# Patient Record
Sex: Male | Born: 1970 | Race: Black or African American | Hispanic: No | Marital: Single | Smoking: Never smoker
Health system: Southern US, Community
[De-identification: ages and names within clinical notes are randomized; demographics above are authoritative.]

## PROBLEM LIST (undated history)

## (undated) DIAGNOSIS — J939 Pneumothorax, unspecified: Secondary | ICD-10-CM

## (undated) HISTORY — PX: CHEST TUBE INSERTION: SHX231

---

## 2008-12-18 ENCOUNTER — Ambulatory Visit: Payer: Self-pay | Admitting: Cardiothoracic Surgery

## 2008-12-18 ENCOUNTER — Encounter: Payer: Self-pay | Admitting: Emergency Medicine

## 2008-12-18 ENCOUNTER — Ambulatory Visit: Payer: Self-pay | Admitting: Diagnostic Radiology

## 2008-12-18 ENCOUNTER — Inpatient Hospital Stay (HOSPITAL_COMMUNITY): Admission: EM | Admit: 2008-12-18 | Discharge: 2008-12-22 | Payer: Self-pay | Admitting: Emergency Medicine

## 2008-12-29 ENCOUNTER — Ambulatory Visit: Payer: Self-pay | Admitting: Cardiothoracic Surgery

## 2009-01-06 ENCOUNTER — Encounter: Admission: RE | Admit: 2009-01-06 | Discharge: 2009-01-06 | Payer: Self-pay | Admitting: Cardiothoracic Surgery

## 2009-01-06 ENCOUNTER — Ambulatory Visit: Payer: Self-pay | Admitting: Cardiothoracic Surgery

## 2010-01-30 ENCOUNTER — Emergency Department (HOSPITAL_BASED_OUTPATIENT_CLINIC_OR_DEPARTMENT_OTHER): Admission: EM | Admit: 2010-01-30 | Discharge: 2010-01-30 | Payer: Self-pay | Admitting: Emergency Medicine

## 2010-07-31 NOTE — Assessment & Plan Note (Signed)
OFFICE VISIT   Brett Stewart, Brett Stewart  DOB:  [birth year]                                        January 06, 2009  CHART #:  [id number]   CURRENT PROBLEMS:  Status post right spontaneous pneumothorax treated  with chest tube on December 18, 2008.   PRESENT ILLNESS:  The patient returns for his post hospital office visit  after being treated with a right chest tube for a 90% spontaneous right  pneumothorax.  This was his first recurrence.  He has had no recurrent  symptoms other than some incisional pain at a chest tube site.  The site  is healed, and the suture has been removed.  He is a nonsmoker.  He is  ready to increase his activity level.   PHYSICAL EXAMINATION:  Blood pressure 117/70, pulse 70, respirations 18,  saturation 99% on room air.  Breath sounds are clear and equal, and the  chest tube site is well healed.   IMAGING:  A PA and lateral chest x-ray shows no pneumothorax with clear  lung fields, no pleural effusion.   PLAN:  I told the patient he could resume normal activities but not to  do any heavy lifting more than 20 pounds until 6 weeks after the event  (late November).  He knows to call us if he has recurrent symptoms or  after hours report to the [facility] Emergency Room for chest x-ray.  A  prescription for Lortab 5.0 was provided without refill.   Kerin Perna, M.D.  Electronically Signed   PV/MEDQ  D:  01/06/2009  T:  01/06/2009  Job:  [id number]

## 2010-10-15 IMAGING — CR DG CHEST 1V PORT
1 series · 1 of 1 positions shown · non-contrast
Comparison: Earlier the same date.

CLINICAL DATA: Chest tube placement.

PORTABLE CHEST - 1 VIEW

[view not recorded]
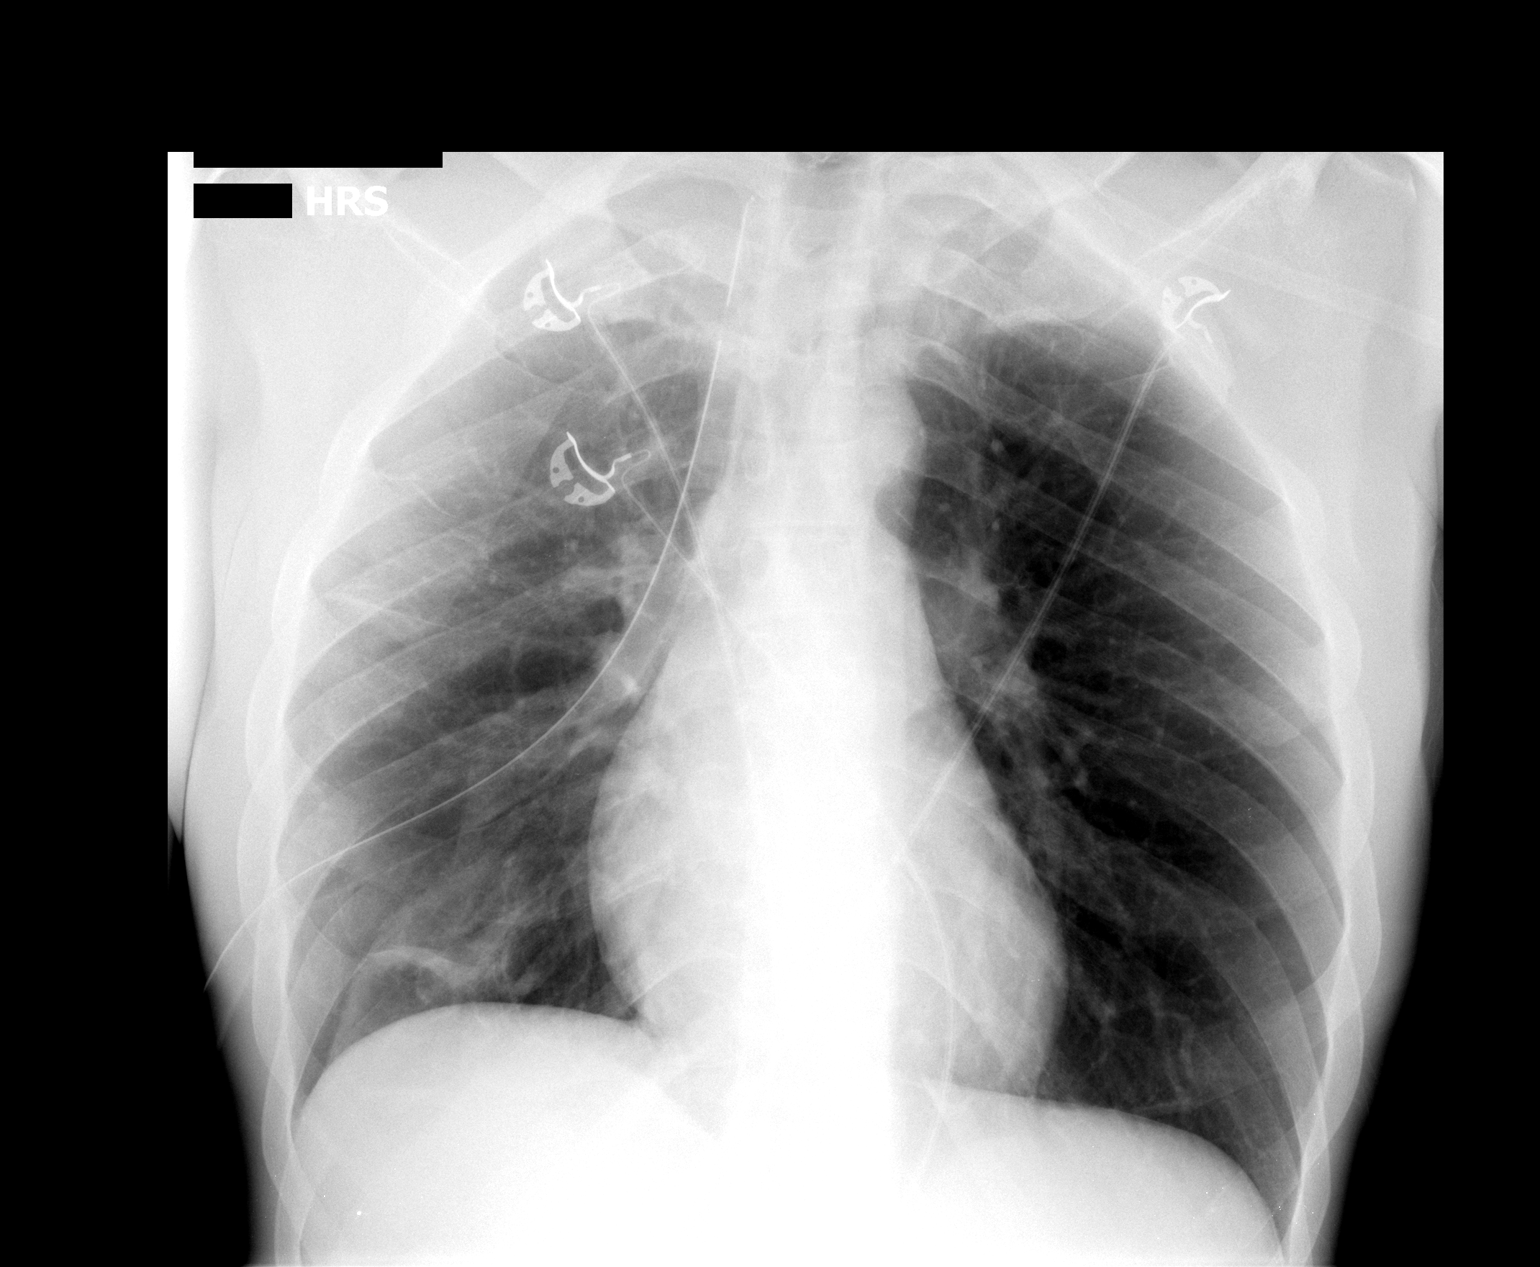

[1 of 1 positions shown; findings below may reference images not displayed]

FINDINGS: 5155 hours.  There has been interval placement of a right
chest tube.  This appears satisfactorily positioned.  The
previously demonstrated large right-sided pneumothorax has been
evacuated.  There may be minimal residual pleural air near the
right costophrenic angle.  There is no mediastinal shift.  The
lungs are clear.  The heart size and mediastinal contours are
stable.
IMPRESSION: Near complete evacuation of large right-sided pneumothorax
following chest tube placement.

## 2010-10-16 IMAGING — CR DG CHEST 1V PORT
1 series · 1 of 1 positions shown · non-contrast
Comparison: 12/18/2008.

CLINICAL DATA: Right-sided spontaneous pneumothorax.

PORTABLE CHEST - 1 VIEW

[view not recorded]
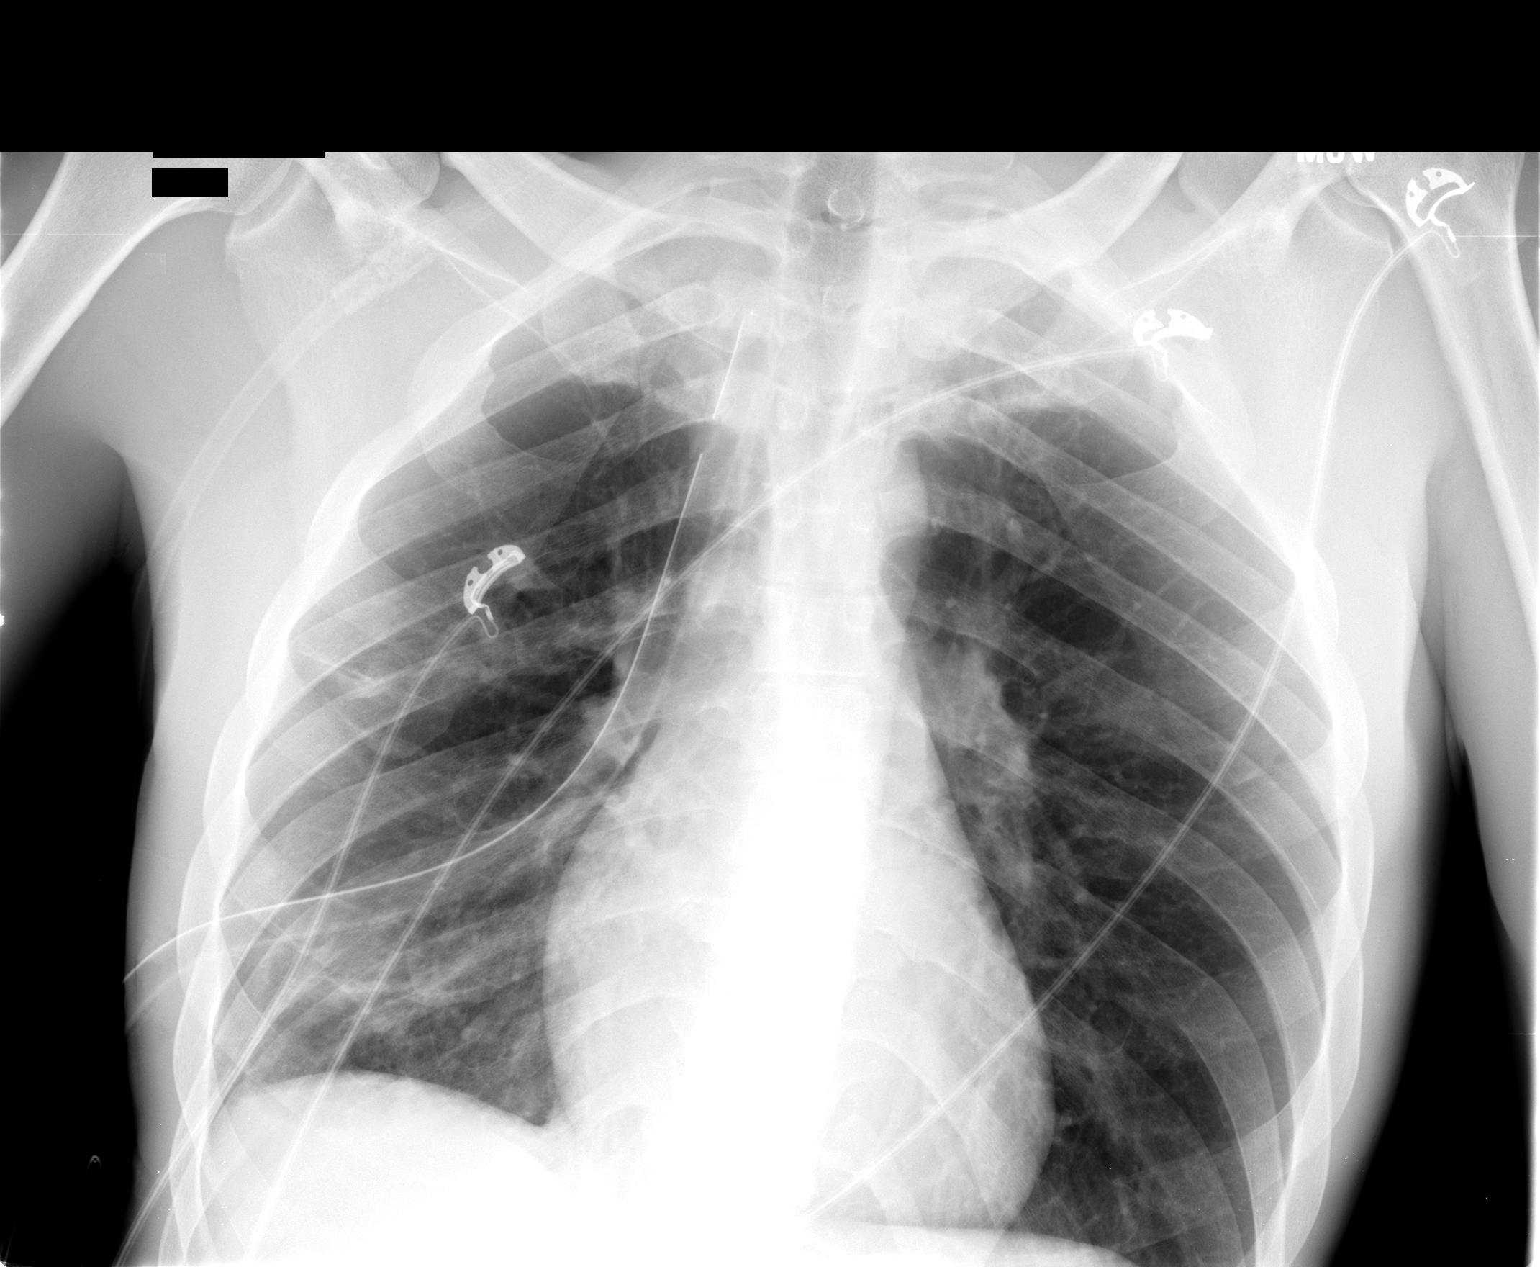

[1 of 1 positions shown; findings below may reference images not displayed]

FINDINGS: Right-sided apically directed thoracostomy tube remains
unchanged compared to prior.  There has been improvement in the
lateral right basilar pneumothorax with probable small area of
loculated or anterior pneumothorax at the right lung base.  This
could also represent a bleb or bulla.  Areas of atelectasis are
present along with pleural reaction along the thoracostomy tube.
Monitoring leads are projected over the chest.  Cardiomediastinal
contours unchanged.  Left lung within normal limits aside from
exclusion of the left costophrenic angle from view.
IMPRESSION: Unchanged right thoracostomy tube. Question small anterior
pneumothorax at the right lung base.

## 2010-10-18 IMAGING — CR DG CHEST 2V
2 series · 2 of 2 positions shown · non-contrast
Comparison: Chest radiograph 12/20/2008

CLINICAL DATA: Pneumothorax

CHEST - 2 VIEW

[w chest pa]
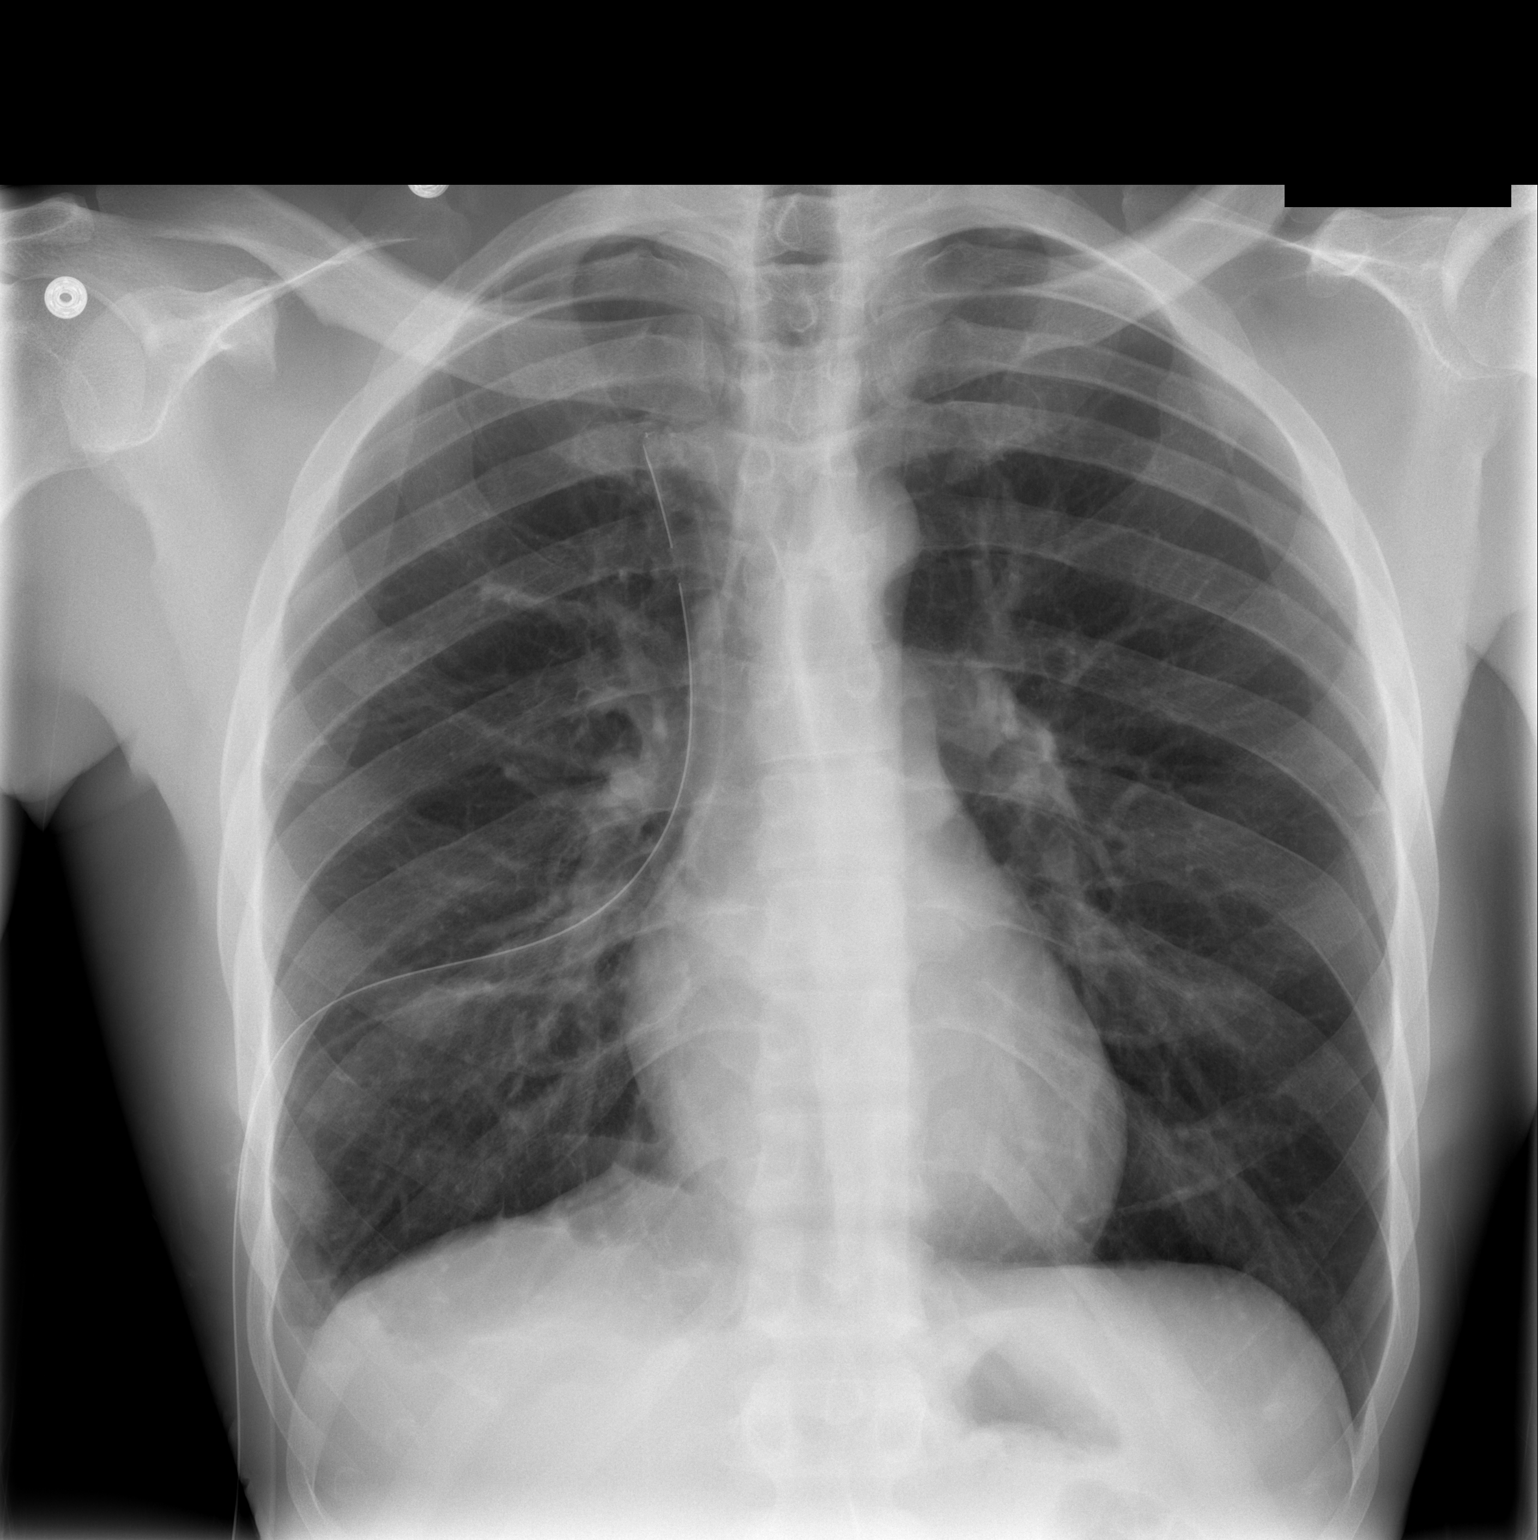

[w chest lat]
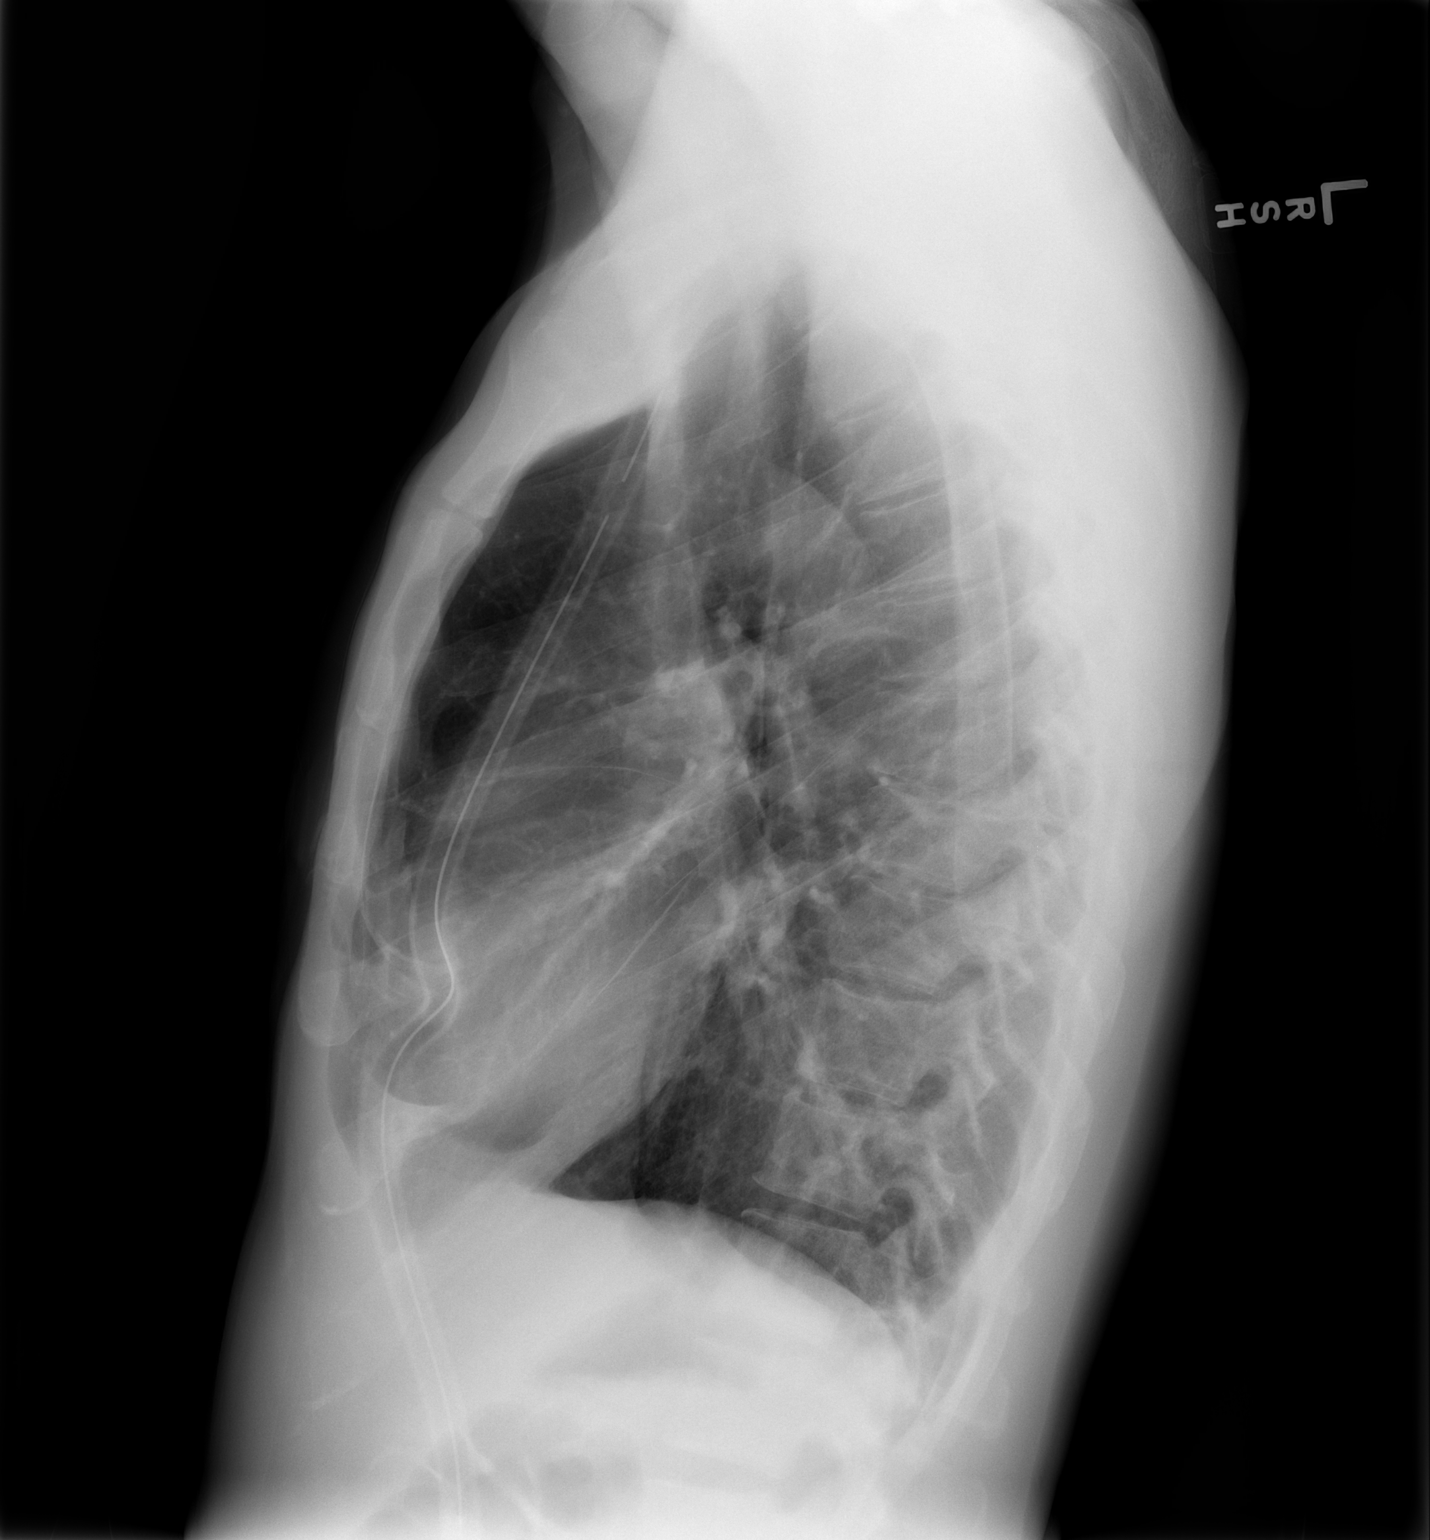

[2 of 2 positions shown; findings below may reference images not displayed]

FINDINGS: Normal mediastinum and cardiac silhouette.  Right chest
tube remains in place.  Trace right apical pneumothorax decreased
from prior.  Small right effusion.  Right lung atelectasis
centrally.  Left lung is clear.
IMPRESSION: 1.  Trace right apical pneumothorax decreased from prior.  Chest
tube in place.
2.  Small right pleural effusion.

## 2010-10-19 IMAGING — CR DG CHEST 2V
2 series · 2 of 2 positions shown · non-contrast
Comparison: 12/21/2008 and earlier

CLINICAL DATA: Right pneumothorax

CHEST - 2 VIEW

[w chest pa]
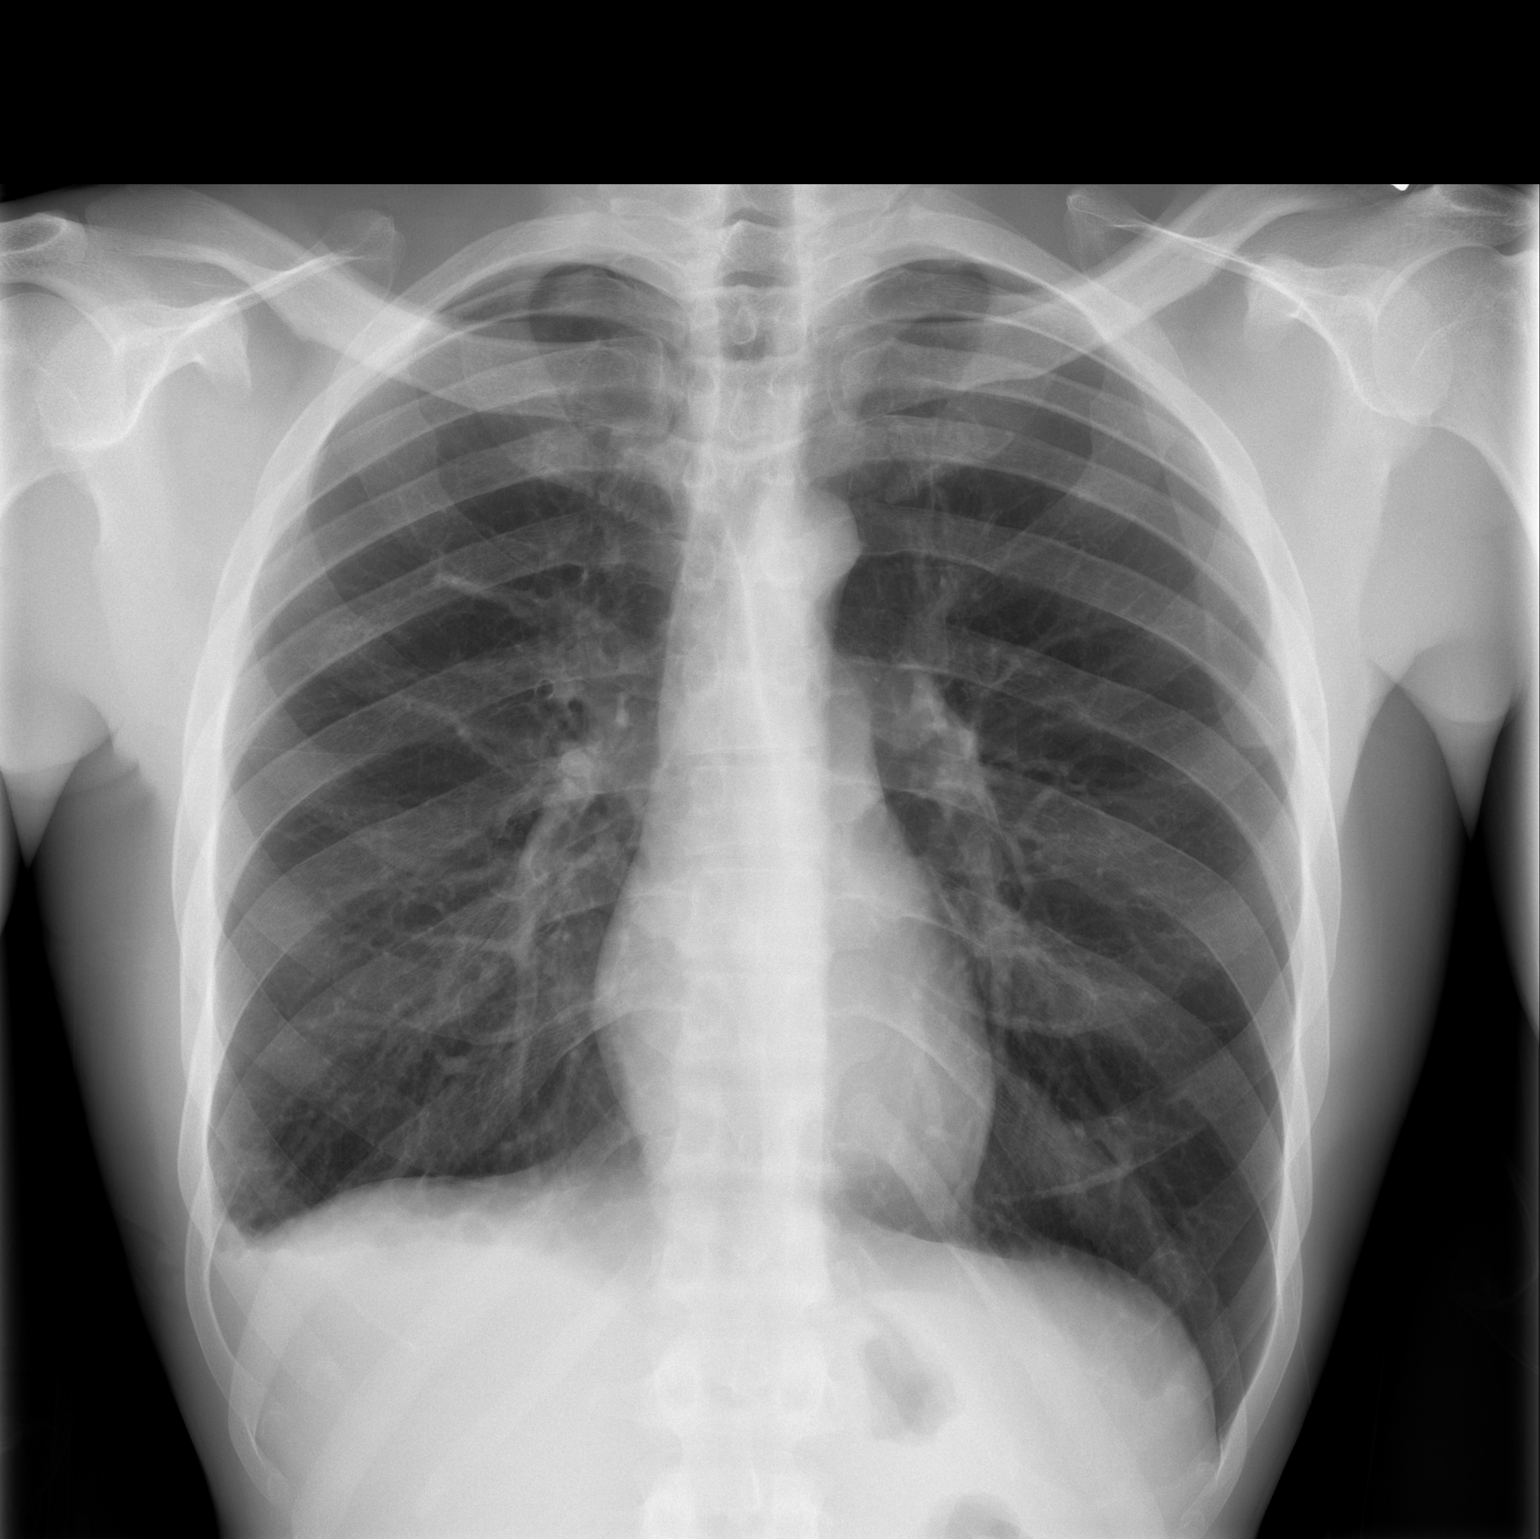

[w chest lat]
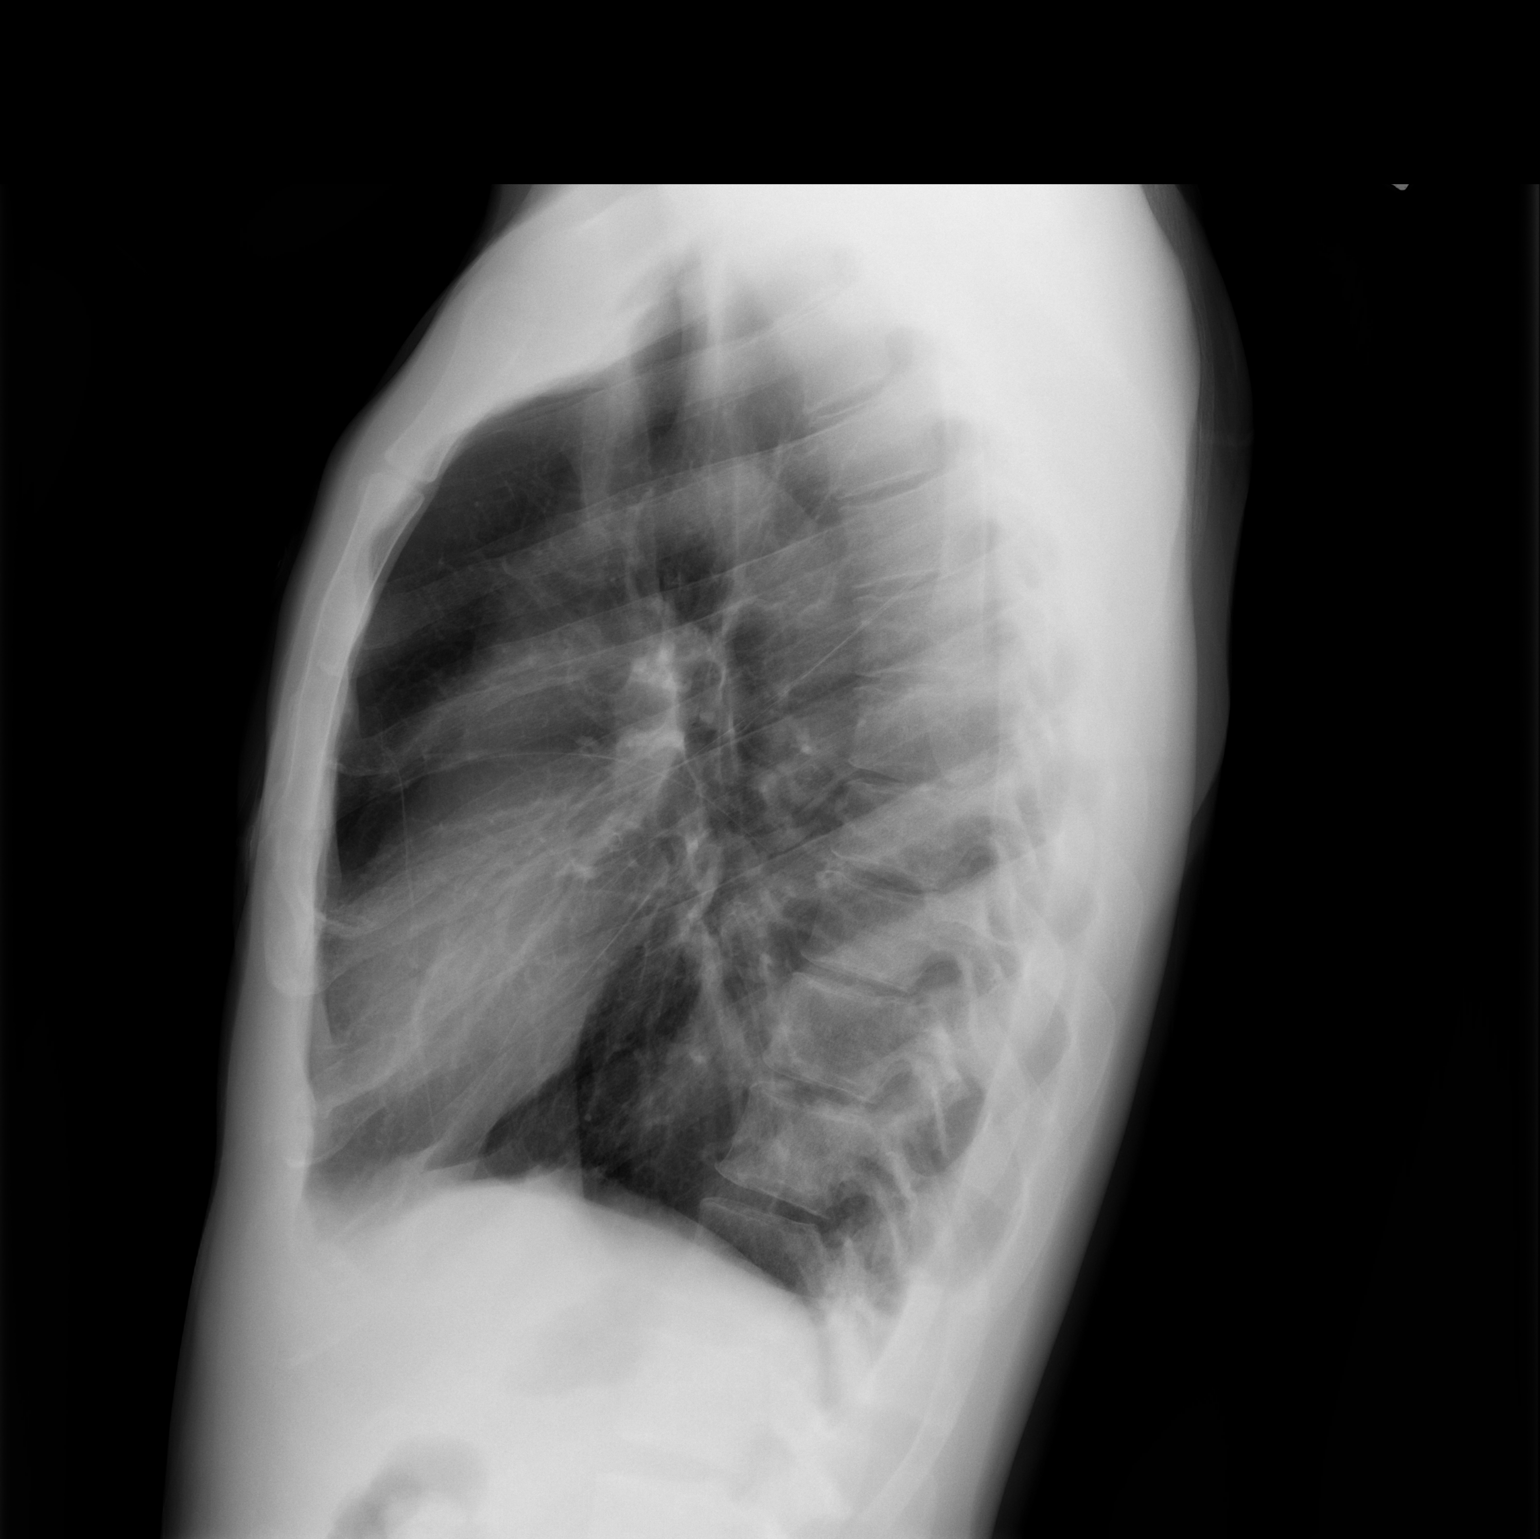

[2 of 2 positions shown; findings below may reference images not displayed]

FINDINGS: No definite right pneumothorax.  Lungs hyperaerated but
clear.  There is either a small right effusion or right pleural
thickening.

Heart and mediastinal contours normal.
IMPRESSION: 1.  No pneumothorax.
2.  Lungs hyperaerated. No active airspace disease.
3.  Small right pleural effusion versus right pleural thickening.

## 2013-03-15 ENCOUNTER — Emergency Department (HOSPITAL_BASED_OUTPATIENT_CLINIC_OR_DEPARTMENT_OTHER)
Admission: EM | Admit: 2013-03-15 | Discharge: 2013-03-15 | Disposition: A | Discharge status: Home / Self Care | Payer: BC Managed Care – PPO | Attending: Emergency Medicine | Admitting: Emergency Medicine

## 2013-03-15 ENCOUNTER — Encounter (HOSPITAL_BASED_OUTPATIENT_CLINIC_OR_DEPARTMENT_OTHER): Payer: Self-pay | Admitting: Emergency Medicine

## 2013-03-15 DIAGNOSIS — B349 Viral infection, unspecified: Secondary | ICD-10-CM

## 2013-03-15 DIAGNOSIS — R059 Cough, unspecified: Secondary | ICD-10-CM | POA: Insufficient documentation

## 2013-03-15 DIAGNOSIS — R05 Cough: Secondary | ICD-10-CM | POA: Insufficient documentation

## 2013-03-15 DIAGNOSIS — B9789 Other viral agents as the cause of diseases classified elsewhere: Secondary | ICD-10-CM | POA: Insufficient documentation

## 2013-03-15 HISTORY — DX: Pneumothorax, unspecified: J93.9

## 2013-03-15 MED ORDER — IBUPROFEN 800 MG PO TABS: 800.0000 mg | ORAL_TABLET | Freq: Three times a day (TID) | ORAL | Status: AC

## 2013-03-15 NOTE — ED Notes (Signed)
Fever, chills, back pain, cough x 3 days-has been around family dx with flu

## 2013-03-15 NOTE — ED Provider Notes (Signed)
Medical screening examination/treatment/procedure(s) were performed by non-physician practitioner and as supervising physician I was immediately available for consultation/collaboration.  EKG Interpretation   None         Gilda Crease, MD 03/15/13 [phone]

## 2013-03-15 NOTE — ED Notes (Signed)
Pt reports generalized body ches, sore throat and a possible fever x 2 days.  Symptoms unrelieved after taking OTC medications and family had the flu.

## 2013-03-15 NOTE — ED Provider Notes (Signed)
CSN: [id number]     Arrival date & time 03/15/13  1416 History   First MD Initiated Contact with Patient 03/15/13 1447     Chief Complaint  Patient presents with  . URI   (Consider location/radiation/quality/duration/timing/severity/associated sxs/prior Treatment) HPI Comments: Pt states that for the last 3 days he has had fever, cough, chills and myalgias. Pt states that family was diagnosed with flu and treated:denies vomiting and diarrhea:has tried otc medication without relief  The history is provided by the patient. No language interpreter was used.    Past Medical History  Diagnosis Date  . Pneumothorax    Past Surgical History  Procedure Laterality Date  . Chest tube insertion     No family history on file. History  Substance Use Topics  . Smoking status: Never Smoker   . Smokeless tobacco: Not on file  . Alcohol Use: No    Review of Systems  Constitutional: Positive for fever.  HENT: Positive for congestion.   Respiratory: Positive for cough.   Cardiovascular: Negative.     Allergies  Review of patient's allergies indicates no known allergies.  Home Medications  No current outpatient prescriptions on file. BP 112/74  Pulse 83  Temp(Src) 98.9 F (37.2 C) (Oral)  Resp 14  Ht 6\' 2"  (1.88 m)  Wt 162 lb (73.483 kg)  BMI 20.79 kg/m2  SpO2 99% Physical Exam  Nursing note and vitals reviewed. Constitutional: He is oriented to person, place, and time. He appears well-developed and well-nourished.  HENT:  Head: Normocephalic and atraumatic.  Right Ear: External ear normal.  Left Ear: External ear normal.  Mouth/Throat: Posterior oropharyngeal erythema present.  Eyes: Conjunctivae and EOM are normal.  Neck: Normal range of motion. Neck supple.  Cardiovascular: Normal rate and regular rhythm.   Pulmonary/Chest: Effort normal and breath sounds normal.  Abdominal: Bowel sounds are normal. There is no tenderness.  Musculoskeletal: Normal range of motion.   Neurological: He is alert and oriented to person, place, and time.  Skin: Skin is warm and dry.    ED Course  Procedures (including critical care time) Labs Review Labs Reviewed - No data to display Imaging Review No results found.  EKG Interpretation   None       MDM   1. Viral illness    Likely viral:discussed with pt that tamiflu would likely not be effective at this time   Teressa Lower, NP 03/15/13 1538

## 2014-04-15 ENCOUNTER — Ambulatory Visit: Payer: Self-pay

## 2016-07-12 ENCOUNTER — Emergency Department (HOSPITAL_BASED_OUTPATIENT_CLINIC_OR_DEPARTMENT_OTHER)
Admission: EM | Admit: 2016-07-12 | Discharge: 2016-07-12 | Disposition: A | Discharge status: Home / Self Care | Payer: BC Managed Care – PPO | Attending: Emergency Medicine | Admitting: Emergency Medicine

## 2016-07-12 ENCOUNTER — Encounter (HOSPITAL_BASED_OUTPATIENT_CLINIC_OR_DEPARTMENT_OTHER): Payer: Self-pay | Admitting: Emergency Medicine

## 2016-07-12 DIAGNOSIS — J029 Acute pharyngitis, unspecified: Secondary | ICD-10-CM | POA: Diagnosis not present

## 2016-07-12 DIAGNOSIS — Z791 Long term (current) use of non-steroidal anti-inflammatories (NSAID): Secondary | ICD-10-CM | POA: Diagnosis not present

## 2016-07-12 LAB — RAPID STREP SCREEN (MED CTR MEBANE ONLY): Streptococcus, Group A Screen (Direct): NEGATIVE

## 2016-07-12 MED ORDER — IBUPROFEN 400 MG PO TABS
400.0000 mg | ORAL_TABLET | Freq: Once | ORAL | Status: AC
  Administered 2016-07-12: 400 mg via ORAL
  Filled 2016-07-12: qty 1

## 2016-07-12 NOTE — ED Notes (Signed)
ED Provider at bedside. 

## 2016-07-12 NOTE — ED Triage Notes (Signed)
Sore throat x 3 days

## 2016-07-12 NOTE — Discharge Instructions (Signed)
It was our pleasure to provide your ER care today - we hope that you feel better.  Your strep test is negative.   Your symptoms are likely the result of a viral illness, like a cold or flu, that should run its course and get better in the next few days.  Rest. Drink plenty of fluids.  Use throat lozenges, and take tylenol or motrin as need.   Follow up with primary care doctor in 1 week if symptoms fail to improve/resolve.  Return to ER if worse, trouble breathing, other concern.

## 2016-07-12 NOTE — ED Provider Notes (Signed)
[facility] Provider Note   CSN: [id number] Arrival date & time: 07/12/16  0808     History   Chief Complaint Chief Complaint  Patient presents with  . Sore Throat    HPI Brett Stewart is a 46 y.o. male.  Patient c/o sore throat for the past 2-3 days. Symptoms constant, persistent, moderate. No trouble breathing or swallowing. Recent non prod cough, runny nose. subj fever.   Denies known ill contacts. No known mono or strep exposure. Denies sob or chest pain.    The history is provided by the patient.  Sore Throat  Pertinent negatives include no chest pain, no headaches and no shortness of breath.    Past Medical History:  Diagnosis Date  . Pneumothorax     There are no active problems to display for this patient.   Past Surgical History:  Procedure Laterality Date  . CHEST TUBE INSERTION         Home Medications    Prior to Admission medications   Medication Sig Start Date End Date Taking? Authorizing Provider  ibuprofen (ADVIL,MOTRIN) 800 MG tablet Take 1 tablet (800 mg total) by mouth 3 (three) times daily. 03/15/13   Teressa Lower, NP    Family History History reviewed. No pertinent family history.  Social History Social History  Substance Use Topics  . Smoking status: Never Smoker  . Smokeless tobacco: Never Used  . Alcohol use No     Allergies   Patient has no known allergies.   Review of Systems Review of Systems  Constitutional: Negative for diaphoresis.  HENT: Positive for sore throat. Negative for trouble swallowing and voice change.   Respiratory: Negative for shortness of breath.   Cardiovascular: Negative for chest pain and leg swelling.  Gastrointestinal: Negative for diarrhea and vomiting.  Musculoskeletal: Negative for neck pain and neck stiffness.  Skin: Negative for rash.  Neurological: Negative for headaches.     Physical Exam Updated Vital Signs BP 118/82 (BP Location: Right Arm)   Pulse (!) 56    Temp 98 F (36.7 C) (Oral)   Resp 16   Ht  (1.905 m)   Wt 76.2 kg   SpO2 100%   BMI 21.00 kg/m   Physical Exam  Constitutional: He appears well-developed and well-nourished. No distress.  HENT:  Right Ear: External ear normal.  Left Ear: External ear normal.  Nose: Nose normal.  Pharynx erythematous. No asymmetric swelling or abscess. No trismus.   Eyes: Conjunctivae are normal.  Neck: Normal range of motion. Neck supple. No tracheal deviation present. No thyromegaly present.  No stiffness or rigidity.  Cardiovascular: Normal rate, regular rhythm, normal heart sounds and intact distal pulses.  Exam reveals no gallop and no friction rub.   No murmur heard. Pulmonary/Chest: Effort normal and breath sounds normal. No accessory muscle usage. No respiratory distress. He has no wheezes.  Abdominal: Soft. Bowel sounds are normal. He exhibits no distension. There is no tenderness.  No HSM.  Musculoskeletal: He exhibits no edema or tenderness.  Lymphadenopathy:    He has no cervical adenopathy.  Neurological: He is alert.  Skin: Skin is warm and dry. No rash noted. He is not diaphoretic.  Psychiatric: He has a normal mood and affect.  Nursing note and vitals reviewed.    ED Treatments / Results  Labs (all labs ordered are listed, but only abnormal results are displayed) Results for orders placed or performed during the hospital encounter of 07/12/16  Rapid strep  screen (not at [facility])  Result Value Ref Range   Streptococcus, Group A Screen (Direct) NEGATIVE NEGATIVE    EKG  EKG Interpretation None       Radiology No results found.  Procedures Procedures (including critical care time)  Medications Ordered in ED Medications  ibuprofen (ADVIL,MOTRIN) tablet 400 mg (not administered)     Initial Impression / Assessment and Plan / ED Course  I have reviewed the triage vital signs and the nursing notes.  Pertinent labs & imaging results that were available during  my care of the patient were reviewed by me and considered in my medical decision making (see chart for details).  Strep screen negative.   Po fluids.  Motrin po.   Symptoms and exam felt most c/w viral pharyngitis/uri.    Final Clinical Impressions(s) / ED Diagnoses   Final diagnoses:  None    New Prescriptions New Prescriptions   No medications on file     Cathren Laine, MD 07/12/16 [phone]

## 2016-07-14 LAB — CULTURE, GROUP A STREP (THRC)

## 2020-03-10 ENCOUNTER — Emergency Department (HOSPITAL_BASED_OUTPATIENT_CLINIC_OR_DEPARTMENT_OTHER)
Admission: EM | Admit: 2020-03-10 | Discharge: 2020-03-10 | Disposition: A | Discharge status: Home / Self Care | Payer: HRSA Program | Attending: Emergency Medicine | Admitting: Emergency Medicine

## 2020-03-10 ENCOUNTER — Other Ambulatory Visit: Payer: Self-pay

## 2020-03-10 ENCOUNTER — Encounter (HOSPITAL_BASED_OUTPATIENT_CLINIC_OR_DEPARTMENT_OTHER): Payer: Self-pay

## 2020-03-10 DIAGNOSIS — R059 Cough, unspecified: Secondary | ICD-10-CM | POA: Diagnosis present

## 2020-03-10 DIAGNOSIS — U071 COVID-19: Secondary | ICD-10-CM | POA: Diagnosis not present

## 2020-03-10 LAB — RESP PANEL BY RT-PCR (FLU A&B, COVID) ARPGX2
Influenza A by PCR: NEGATIVE
Influenza B by PCR: NEGATIVE
SARS Coronavirus 2 by RT PCR: POSITIVE — AB

## 2020-03-10 NOTE — ED Triage Notes (Signed)
Pt arrives ambulatory to ED with c/o cough, body aches, and runny nose X1 week. PT reports that he lost taste last night. Pt is not vaccinated.

## 2020-03-10 NOTE — Discharge Instructions (Addendum)
Please read instructions below.  You can take tylenol or ibuprofen as needed for sore throat or fever.  Drink plenty of water.  Use saline nasal spray for congestion. Follow up with the post Covid clinic at [facility] as needed. You will need to isolate at home for minimum of 14 days starting on 03/06/2020.  At the end of the 14-day period, you will need to be fever free without the help of medication with symptoms improving for minimum of 24 hours to end quarantine. Return to the ER for inability to swallow liquids, difficulty breathing, or new or worsening symptoms.

## 2020-03-10 NOTE — ED Provider Notes (Signed)
[facility] EMERGENCY DEPARTMENT Provider Note   CSN: [id number] Arrival date & time: 03/10/20  1157     History Chief Complaint  Patient presents with  . Cough    Brett Stewart is a 49 y.o. male without significant past medical history, presenting to the emergency department with complaint of URI symptoms that began on Monday night.  He endorses cough which is gradually improving, fever with T-max of 101 F which has resolved, body aches.  He states he has developed a sore throat today, loss of taste yesterday which is what warranted his presentation to the ED for evaluation.  Denies any abdominal complaints, shortness of breath.  He is not vaccinated against Covid.  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  His family members are asymptomatic.  The history is provided by the patient.       Past Medical History:  Diagnosis Date  . Pneumothorax     There are no problems to display for this patient.   Past Surgical History:  Procedure Laterality Date  . CHEST TUBE INSERTION         No family history on file.  Social History   Tobacco Use  . Smoking status: Never Smoker  . Smokeless tobacco: Never Used  Substance Use Topics  . Alcohol use: No  . Drug use: No    Home Medications Prior to Admission medications   Medication Sig Start Date End Date Taking? Authorizing Provider  ibuprofen (ADVIL,MOTRIN) 800 MG tablet Take 1 tablet (800 mg total) by mouth 3 (three) times daily. 03/15/13   Teressa Lower, NP    Allergies    Patient has no known allergies.  Review of Systems   Review of Systems  All other systems reviewed and are negative.   Physical Exam Updated Vital Signs BP 117/79 (BP Location: Left Arm)   Pulse (!) 58   Temp 98.6 F (37 C) (Oral)   Resp 18   Ht 6\' 3"  (1.905 m)   Wt 74.8 kg   SpO2 100%   BMI 20.62 kg/m   Physical Exam Vitals and nursing note reviewed.  Constitutional:      General:  He is not in acute distress.    Appearance: He is well-developed.  HENT:     Head: Normocephalic and atraumatic.  Eyes:     Conjunctiva/sclera: Conjunctivae normal.  Cardiovascular:     Rate and Rhythm: Normal rate.  Pulmonary:     Effort: Pulmonary effort is normal.     Comments: Speaking in full sentences with normal respiratory effort. Neurological:     Mental Status: He is alert.  Psychiatric:        Mood and Affect: Mood normal.        Behavior: Behavior normal.     ED Results / Procedures / Treatments   Labs (all labs ordered are listed, but only abnormal results are displayed) Labs Reviewed  RESP PANEL BY RT-PCR (FLU A&B, COVID) ARPGX2 - Abnormal; Notable for the following components:      Result Value   SARS Coronavirus 2 by RT PCR POSITIVE (*)    All other components within normal limits    EKG None  Radiology No results found.  Procedures Procedures (including critical care time)  Medications Ordered in ED Medications - No data to display  ED Course  I have reviewed the triage vital signs and the nursing notes.  Pertinent labs & imaging results that were  available during my care of the patient were reviewed by me and considered in my medical decision making (see chart for details).    MDM Rules/Calculators/A&P                         Brett Stewart was evaluated in Emergency Department on 03/10/2020 for the symptoms described in the history of present illness. He was evaluated in the context of the global COVID-19 pandemic, which necessitated consideration that the patient might be at risk for infection with the SARS-CoV-2 virus that causes COVID-19. Institutional protocols and algorithms that pertain to the evaluation of patients at risk for COVID-19 are in a state of rapid change based on information released by regulatory bodies including the CDC and federal and state organizations. These policies and algorithms were followed during the patient's care in  the ED.  Patient presenting for URI symptoms began on Monday.  Symptoms are overall gradually improving, however he developed loss of taste yesterday therefore presents to the ED for Covid test.  He is well-appearing and in no distress, afebrile with stable vital signs.  Speaking in full sentences without difficulty.  Recommend continued symptomatic management.  Patient does not qualify for Mab infusions.  Discussed isolation precautions per current CDC guidelines for both him and his family members.  Referral provided to post Covid clinic at [facility] for follow-up as needed as he does not currently of a PCP.  Return precautions discussed.  Discussed results, findings, treatment and follow up. Patient advised of return precautions. Patient verbalized understanding and agreed with plan.  Final Clinical Impression(s) / ED Diagnoses Final diagnoses:  COVID-19    Rx / DC Orders ED Discharge Orders    None       Camrie Stock, [place], PA-C 03/10/20 1449    Jacalyn Lefevre, MD 03/10/20 1521

## 2021-02-05 ENCOUNTER — Encounter (HOSPITAL_BASED_OUTPATIENT_CLINIC_OR_DEPARTMENT_OTHER): Payer: Self-pay

## 2021-02-05 ENCOUNTER — Emergency Department (HOSPITAL_BASED_OUTPATIENT_CLINIC_OR_DEPARTMENT_OTHER)
Admission: EM | Admit: 2021-02-05 | Discharge: 2021-02-05 | Disposition: A | Discharge status: Home / Self Care | Payer: Self-pay | Attending: Emergency Medicine | Admitting: Emergency Medicine

## 2021-02-05 ENCOUNTER — Other Ambulatory Visit: Payer: Self-pay

## 2021-02-05 DIAGNOSIS — Z20822 Contact with and (suspected) exposure to covid-19: Secondary | ICD-10-CM | POA: Insufficient documentation

## 2021-02-05 DIAGNOSIS — R197 Diarrhea, unspecified: Secondary | ICD-10-CM | POA: Insufficient documentation

## 2021-02-05 DIAGNOSIS — R300 Dysuria: Secondary | ICD-10-CM | POA: Insufficient documentation

## 2021-02-05 DIAGNOSIS — J029 Acute pharyngitis, unspecified: Secondary | ICD-10-CM | POA: Insufficient documentation

## 2021-02-05 DIAGNOSIS — R350 Frequency of micturition: Secondary | ICD-10-CM | POA: Insufficient documentation

## 2021-02-05 DIAGNOSIS — R3915 Urgency of urination: Secondary | ICD-10-CM | POA: Insufficient documentation

## 2021-02-05 LAB — RESP PANEL BY RT-PCR (FLU A&B, COVID) ARPGX2
Influenza A by PCR: NEGATIVE
Influenza B by PCR: NEGATIVE
SARS Coronavirus 2 by RT PCR: NEGATIVE

## 2021-02-05 LAB — URINALYSIS, ROUTINE W REFLEX MICROSCOPIC
Bilirubin Urine: NEGATIVE
Glucose, UA: NEGATIVE mg/dL
Hgb urine dipstick: NEGATIVE
Ketones, ur: NEGATIVE mg/dL
Leukocytes,Ua: NEGATIVE
Nitrite: NEGATIVE
Protein, ur: NEGATIVE mg/dL
Specific Gravity, Urine: >=1.03 (ref 1.005–1.030)
pH: 5.5 (ref 5.0–8.0)

## 2021-02-05 NOTE — ED Triage Notes (Addendum)
Pt had temp of 99, sore throat last night. Also c/o burning with urination. Lower abdominal pain. Afebrile during triage, Denies N/V/D.

## 2021-02-05 NOTE — ED Provider Notes (Signed)
[facility] EMERGENCY DEPARTMENT Provider Note   CSN: [id number] Arrival date & time: 02/05/21  1039     History Chief Complaint  Patient presents with   Dysuria   Sore Throat    Brett Stewart is a 50 y.o. male who presents to the emergency department with intermittent dysuria and subjective fever over the last 4 days.  He states he had a urinary tract infection in the remote past which he states feels similar today.  He reports associated diarrhea, sore throat, and general malaise.  No chest pain or shortness of breath.  He denies abdominal pain, nausea, vomiting.  Denies any new sexual partners.  Dysuria seems to be worse in the morning.  He reports associated urinary urgency and frequency.   Dysuria Presenting symptoms: dysuria   Sore Throat      Past Medical History:  Diagnosis Date   Pneumothorax     There are no problems to display for this patient.   Past Surgical History:  Procedure Laterality Date   CHEST TUBE INSERTION         History reviewed. No pertinent family history.  Social History   Tobacco Use   Smoking status: Never   Smokeless tobacco: Never  Substance Use Topics   Alcohol use: No   Drug use: No    Home Medications Prior to Admission medications   Medication Sig Start Date End Date Taking? Authorizing Provider  ibuprofen (ADVIL,MOTRIN) 800 MG tablet Take 1 tablet (800 mg total) by mouth 3 (three) times daily. 03/15/13   Teressa Lower, NP    Allergies    Patient has no known allergies.  Review of Systems   Review of Systems  Genitourinary:  Positive for dysuria.  All other systems reviewed and are negative.  Physical Exam Updated Vital Signs BP 105/66 (BP Location: Left Arm)   Pulse 62   Temp 98.2 F (36.8 C) (Oral)   Resp 16   Ht 6\' 3"  (1.905 m)   Wt 76.2 kg   SpO2 99%   BMI 21.00 kg/m   Physical Exam Vitals and nursing note reviewed.  Constitutional:      General: He is not in acute distress.     Appearance: Normal appearance.  HENT:     Head: Normocephalic and atraumatic.     Mouth/Throat:     Comments: No tonsillar exudate, erythema, or significant edema.  Uvula is midline.  There is moderate amount of posterior pharyngeal erythema. Eyes:     General:        Right eye: No discharge.        Left eye: No discharge.  Cardiovascular:     Comments: Regular rate and rhythm.  S1/S2 are distinct without any evidence of murmur, rubs, or gallops.  Radial pulses are 2+ bilaterally.  Dorsalis pedis pulses are 2+ bilaterally.  No evidence of pedal edema. Pulmonary:     Comments: Clear to auscultation bilaterally.  Normal effort.  No respiratory distress.  No evidence of wheezes, rales, or rhonchi heard throughout. Abdominal:     General: Abdomen is flat. Bowel sounds are normal. There is no distension.     Tenderness: There is no abdominal tenderness. There is no guarding or rebound.  Musculoskeletal:        General: Normal range of motion.     Cervical back: Neck supple.  Skin:    General: Skin is warm and dry.     Findings: No rash.  Neurological:  General: No focal deficit present.     Mental Status: He is alert.  Psychiatric:        Mood and Affect: Mood normal.        Behavior: Behavior normal.    ED Results / Procedures / Treatments   Labs (all labs ordered are listed, but only abnormal results are displayed) Labs Reviewed  RESP PANEL BY RT-PCR (FLU A&B, COVID) ARPGX2  URINALYSIS, ROUTINE W REFLEX MICROSCOPIC    EKG None  Radiology No results found.  Procedures Procedures   Medications Ordered in ED Medications - No data to display  ED Course  I have reviewed the triage vital signs and the nursing notes.  Pertinent labs & imaging results that were available during my care of the patient were reviewed by me and considered in my medical decision making (see chart for details).    MDM Rules/Calculators/A&P                          Brett Stewart is a 50  y.o. male who presents the emerge apartment with dysuria and upper respiratory symptoms.  He is in no respiratory or acute distress at this time.  Urinalysis was ordered in triage.  I will also add on a COVID/influenza swab.  Patient is otherwise healthy and I do not feel that additional blood work was necessary at this time.  COVID/influenza was negative.  Urinalysis was clean.  No signs of infection.  Updated patient.  Given his symptoms this is likely a viral illness.  I have a low suspicion for strep throat, PTA, and RPA at this time.  His vital signs are otherwise stable and he is in no acute distress at this time.  Discussed at length conservative measures including Tylenol/ibuprofen for fever and muscle aches, drinking plenty of fluids, and getting plenty of rest.  Patient expressed full understanding.  He is safe for discharge.    Final Clinical Impression(s) / ED Diagnoses Final diagnoses:  Sore throat  Dysuria    Rx / DC Orders ED Discharge Orders     None        Teressa Lower, [place] 02/05/21 1221    Terald Sleeper, MD 02/05/21 1501

## 2021-02-05 NOTE — Discharge Instructions (Addendum)
You tested negative for influenza and COVID today.  Your urinalysis was also normal.  I still think this is likely a viral illness that will resolve on its own.  Please drink plenty of fluids and get plenty of rest.  Tylenol/ibuprofen for pain and fevers.  Please return to the emergency department if you experience worsening urinary symptoms, severe fevers, trouble swallowing, shortness of breath, or any other concerns you might have.

## 2023-09-03 ENCOUNTER — Other Ambulatory Visit: Payer: Self-pay

## 2023-09-03 ENCOUNTER — Emergency Department (HOSPITAL_BASED_OUTPATIENT_CLINIC_OR_DEPARTMENT_OTHER)
Admission: EM | Admit: 2023-09-03 | Discharge: 2023-09-03 | Disposition: A | Discharge status: Home / Self Care | Attending: Emergency Medicine | Admitting: Emergency Medicine

## 2023-09-03 ENCOUNTER — Emergency Department (HOSPITAL_BASED_OUTPATIENT_CLINIC_OR_DEPARTMENT_OTHER)

## 2023-09-03 ENCOUNTER — Encounter (HOSPITAL_BASED_OUTPATIENT_CLINIC_OR_DEPARTMENT_OTHER): Payer: Self-pay | Admitting: Emergency Medicine

## 2023-09-03 DIAGNOSIS — R0789 Other chest pain: Secondary | ICD-10-CM | POA: Insufficient documentation

## 2023-09-03 DIAGNOSIS — R052 Subacute cough: Secondary | ICD-10-CM | POA: Insufficient documentation

## 2023-09-03 LAB — CBC
HCT: 36.4 % — ABNORMAL LOW (ref 39.0–52.0)
Hemoglobin: 12.6 g/dL — ABNORMAL LOW (ref 13.0–17.0)
MCH: 32.1 pg (ref 26.0–34.0)
MCHC: 34.6 g/dL (ref 30.0–36.0)
MCV: 92.9 fL (ref 80.0–100.0)
Platelets: 187 10*3/uL (ref 150–400)
RBC: 3.92 MIL/uL — ABNORMAL LOW (ref 4.22–5.81)
RDW: 12.8 % (ref 11.5–15.5)
WBC: 8.2 10*3/uL (ref 4.0–10.5)
nRBC: 0 % (ref 0.0–0.2)

## 2023-09-03 LAB — TROPONIN T, HIGH SENSITIVITY: Troponin T High Sensitivity: <15 ng/L (ref <19)

## 2023-09-03 LAB — BASIC METABOLIC PANEL WITH GFR
Anion gap: 12 (ref 5–15)
BUN: 13 mg/dL (ref 6–20)
CO2: 24 mmol/L (ref 22–32)
Calcium: 8.9 mg/dL (ref 8.9–10.3)
Chloride: 102 mmol/L (ref 98–111)
Creatinine, Ser: 1.05 mg/dL (ref 0.61–1.24)
GFR, Estimated: >60 mL/min (ref >60)
Glucose, Bld: 103 mg/dL — ABNORMAL HIGH (ref 70–99)
Potassium: 3.6 mmol/L (ref 3.5–5.1)
Sodium: 138 mmol/L (ref 135–145)

## 2023-09-03 MED ORDER — PROMETHAZINE-DM 6.25-15 MG/5ML PO SYRP: 5.0000 mL | ORAL_SOLUTION | Freq: Four times a day (QID) | ORAL | 0 refills | Status: AC | PRN

## 2023-09-03 MED ORDER — AMOXICILLIN 500 MG PO CAPS: 500.0000 mg | ORAL_CAPSULE | Freq: Two times a day (BID) | ORAL | 0 refills | Status: AC

## 2023-09-03 NOTE — ED Triage Notes (Signed)
 Pt POV reports fever, chills on x6 days, tested neg for covid/strep. Started coughing last night, chest pain to R upper chest started today, worse with coughing. Denies fever over last few days.

## 2023-09-03 NOTE — ED Provider Notes (Signed)
 [facility] Provider Note   CSN: [id number] Arrival date & time: 09/03/23  1633     Patient presents with: Chest Pain and URI   Brett Stewart is a 53 y.o. male.    Chest Pain URI   53 year old male presents emergency department with complaints of cough, nasal congestion, chills, right-sided chest pain.  States that he has been having symptoms for the past 6 to 7 days or so.  Initially began with chills, fevers, nasal congestion, cough.  Most of symptoms have gotten better besides this cough.  Has tried multiple over-the-counter medications without significant improvement of symptoms.  Presents emergency department due to continued symptoms.  States he has been without fever since last week.  Denies any abdominal pain, nausea, vomiting.  Does report right upper chest pain worsened with coughing and symptoms worsen with taking a deep breath.  Past medical history significant for pneumothorax  Prior to Admission medications   Medication Sig Start Date End Date Taking? Authorizing Provider  ibuprofen  (ADVIL ,MOTRIN ) 800 MG tablet Take 1 tablet (800 mg total) by mouth 3 (three) times daily. 03/15/13   Pickering, Vrinda, NP    Allergies: Patient has no known allergies.    Review of Systems  Cardiovascular:  Positive for chest pain.  All other systems reviewed and are negative.   Updated Vital Signs BP 103/69 (BP Location: Left Arm)   Pulse 93   Temp 98.4 F (36.9 C)   Resp 18   Ht 6' 3 (1.905 m)   Wt 71.7 kg   SpO2 97%   BMI 19.75 kg/m   Physical Exam Vitals and nursing note reviewed.  Constitutional:      General: He is not in acute distress.    Appearance: He is well-developed.  HENT:     Head: Normocephalic and atraumatic.   Eyes:     Conjunctiva/sclera: Conjunctivae normal.    Cardiovascular:     Rate and Rhythm: Normal rate and regular rhythm.     Heart sounds: No murmur heard. Pulmonary:     Effort: Pulmonary  effort is normal. No respiratory distress.     Comments: Right upper chest wall tenderness.  Rales auscultated right lower lung field. Chest:     Chest wall: Tenderness present.  Abdominal:     Palpations: Abdomen is soft.     Tenderness: There is no abdominal tenderness.   Musculoskeletal:        General: No swelling.     Cervical back: Neck supple.     Right lower leg: No edema.     Left lower leg: No edema.   Skin:    General: Skin is warm and dry.     Capillary Refill: Capillary refill takes less than 2 seconds.   Neurological:     Mental Status: He is alert.   Psychiatric:        Mood and Affect: Mood normal.     (all labs ordered are listed, but only abnormal results are displayed) Labs Reviewed  BASIC METABOLIC PANEL WITH GFR - Abnormal; Notable for the following components:      Result Value   Glucose, Bld 103 (*)    All other components within normal limits  CBC - Abnormal; Notable for the following components:   RBC 3.92 (*)    Hemoglobin 12.6 (*)    HCT 36.4 (*)    All other components within normal limits  TROPONIN T, HIGH SENSITIVITY  EKG: None  Radiology: No results found.   Procedures   Medications Ordered in the ED - No data to display                                  Medical Decision Making Amount and/or Complexity of Data Reviewed Labs: ordered. Radiology: ordered.   This patient presents to the ED for concern of cough, this involves an extensive number of treatment options, and is a complaint that carries with it a high risk of complications and morbidity.  The differential diagnosis includes COVID, flu, RSV, viral URI, pneumonia, COPD/asthma, GERD, other   Co morbidities that complicate the patient evaluation  See HPI   Additional history obtained:  Additional history obtained from EMR External records from outside source obtained and reviewed including hospital records   Lab Tests:  I Ordered, and personally  interpreted labs.  The pertinent results include: No leukocytosis.  Anemia hemoglobin of 12.6.  Platelets within range.  No Electra abnormalities.  No renal dysfunction.  Troponin of less than 15.   Imaging Studies ordered:  I ordered imaging studies including chest x-ray I independently visualized and interpreted imaging which showed no acute cardiopulmonary abnormality. I agree with the radiologist interpretation   Cardiac Monitoring: / EKG:  The patient was maintained on a cardiac monitor.  I personally viewed and interpreted the cardiac monitored which showed an underlying rhythm of: Sinus rhythm.  PVCs present.   Consultations Obtained:  N/a   Problem List / ED Course / Critical interventions / Medication management  Cough Reevaluation of the patient showed that the patient stayed the same I have reviewed the patients home medicines and have made adjustments as needed   Social Determinants of Health:  Denies tobacco, licit drug use.   Test / Admission - Considered:  Cough Vitals signs within normal range and stable throughout visit. Laboratory/imaging studies significant for: See above 53 year old male presents emergency department with complaints of cough, nasal congestion, chills, right-sided chest pain.  States that he has been having symptoms for the past 6 to 7 days or so.  Initially began with chills, fevers, nasal congestion, cough.  Most of symptoms have gotten better besides this cough.  Has tried multiple over-the-counter medications without significant improvement of symptoms.  Presents emergency department due to continued symptoms.  States he has been without fever since last week.  Denies any abdominal pain, nausea, vomiting.  Does report right upper chest pain worsened with coughing and symptoms worsen with taking a deep breath. On exam, faint Rales auscultated right lower lung field with inhalation.  Right upper chest wall tenderness to palpation.  Workup  today reassuring.  Laboratory studies unremarkable for any acute process.  Patient with negative troponin, EKG with nonspecific T wave changes inferior leads without any reciprocal changes PVCs present; no prior EKG to perform; low suspicion for ACS.  Given duration of patient's symptoms, delta troponin not performed.  Patient without tachycardia, tachypnea, hypoxia, risk factors for PE/DVT; low suspicion for PE.  Suspect patient's chest pain likely secondary to musculoskeletal pain from patient's coughing as it is reproducible on exam.  Chest x-ray without any acute cardiopulmonary abnormality.  Given duration of patient's symptoms as well as rales auscultated on exam, will treat empirically even steroid for possible pneumonia with antibiotics.  Will also send in cough medicine to use as needed.  Recommend further symptomatic therapy as described AVS with close  follow-up with PCP in the outpatient setting.  Treatment plan discussed with patient and he acknowledged her standing was agreeable to said plan.  Patient will well-appearing, afebrile in no acute distress. Worrisome signs and symptoms were discussed with the patient, and the patient acknowledged understanding to return to the ED if noticed. Patient was stable upon discharge.       Final diagnoses:  None    ED Discharge Orders     None          [place] Butter, [place] 09/03/23 1802    Afton Horse T, DO 09/04/23 1527

## 2023-09-03 NOTE — Discharge Instructions (Signed)
 As discussed, your workup today was overall reassuring.  Your chest x-ray did not show any obvious pneumonia but given the crackles heard on exam, we will treat you empirically with antibiotics for concern for pneumonia.  Will also give you a cough syrup to use as needed.  Recommend follow-up primary care for reassessment.  Please do not hesitate to return if the worrisome signs and symptoms we discussed become apparent.
